# Patient Record
Sex: Female | Born: 1980 | Race: White | Hispanic: No | Marital: Married | Smoking: Never smoker
Health system: Southern US, Community
[De-identification: ages and names within clinical notes are randomized; demographics above are authoritative.]

## PROBLEM LIST (undated history)

## (undated) DIAGNOSIS — Z8619 Personal history of other infectious and parasitic diseases: Secondary | ICD-10-CM

## (undated) DIAGNOSIS — G5600 Carpal tunnel syndrome, unspecified upper limb: Secondary | ICD-10-CM

## (undated) DIAGNOSIS — G43909 Migraine, unspecified, not intractable, without status migrainosus: Secondary | ICD-10-CM

## (undated) HISTORY — PX: APPENDECTOMY: SHX54

## (undated) HISTORY — DX: Personal history of other infectious and parasitic diseases: Z86.19

---

## 2005-06-28 ENCOUNTER — Other Ambulatory Visit: Admission: RE | Admit: 2005-06-28 | Discharge: 2005-06-28 | Payer: Self-pay | Admitting: Obstetrics and Gynecology

## 2011-11-07 ENCOUNTER — Other Ambulatory Visit: Payer: Self-pay | Admitting: Obstetrics and Gynecology

## 2011-11-07 DIAGNOSIS — N6310 Unspecified lump in the right breast, unspecified quadrant: Secondary | ICD-10-CM

## 2011-11-14 ENCOUNTER — Ambulatory Visit
Admission: RE | Admit: 2011-11-14 | Discharge: 2011-11-14 | Disposition: A | Discharge status: Home / Self Care | Payer: BC Managed Care – PPO | Source: Ambulatory Visit | Attending: Obstetrics and Gynecology | Admitting: Obstetrics and Gynecology

## 2011-11-14 ENCOUNTER — Other Ambulatory Visit: Payer: Self-pay

## 2011-11-14 DIAGNOSIS — N6310 Unspecified lump in the right breast, unspecified quadrant: Secondary | ICD-10-CM

## 2014-11-09 LAB — OB RESULTS CONSOLE GC/CHLAMYDIA
CHLAMYDIA, DNA PROBE: NEGATIVE
GC PROBE AMP, GENITAL: NEGATIVE

## 2014-11-09 LAB — OB RESULTS CONSOLE HIV ANTIBODY (ROUTINE TESTING): HIV: NONREACTIVE

## 2014-11-09 LAB — OB RESULTS CONSOLE HEPATITIS B SURFACE ANTIGEN: Hepatitis B Surface Ag: NEGATIVE

## 2014-11-09 LAB — OB RESULTS CONSOLE ANTIBODY SCREEN: ANTIBODY SCREEN: NEGATIVE

## 2014-11-09 LAB — OB RESULTS CONSOLE RUBELLA ANTIBODY, IGM: Rubella: IMMUNE

## 2014-11-09 LAB — OB RESULTS CONSOLE ABO/RH: RH Type: POSITIVE

## 2014-11-09 LAB — OB RESULTS CONSOLE RPR: RPR: NONREACTIVE

## 2015-01-21 ENCOUNTER — Other Ambulatory Visit (HOSPITAL_COMMUNITY): Payer: Self-pay | Admitting: Obstetrics and Gynecology

## 2015-01-21 DIAGNOSIS — Z3689 Encounter for other specified antenatal screening: Secondary | ICD-10-CM

## 2015-01-22 ENCOUNTER — Encounter (HOSPITAL_COMMUNITY): Payer: Self-pay

## 2015-01-22 ENCOUNTER — Other Ambulatory Visit (HOSPITAL_COMMUNITY): Payer: Self-pay | Admitting: Obstetrics and Gynecology

## 2015-01-22 DIAGNOSIS — Z3A21 21 weeks gestation of pregnancy: Secondary | ICD-10-CM

## 2015-01-22 DIAGNOSIS — Z3689 Encounter for other specified antenatal screening: Secondary | ICD-10-CM

## 2015-01-25 ENCOUNTER — Other Ambulatory Visit (HOSPITAL_COMMUNITY): Payer: Self-pay | Admitting: Obstetrics and Gynecology

## 2015-01-25 ENCOUNTER — Ambulatory Visit (HOSPITAL_COMMUNITY)
Admission: RE | Admit: 2015-01-25 | Discharge: 2015-01-25 | Disposition: A | Discharge status: Home / Self Care | Payer: BC Managed Care – PPO | Source: Ambulatory Visit | Attending: Obstetrics and Gynecology | Admitting: Obstetrics and Gynecology

## 2015-01-25 DIAGNOSIS — Z3689 Encounter for other specified antenatal screening: Secondary | ICD-10-CM

## 2015-01-25 DIAGNOSIS — Z3A21 21 weeks gestation of pregnancy: Secondary | ICD-10-CM | POA: Insufficient documentation

## 2015-01-25 DIAGNOSIS — O283 Abnormal ultrasonic finding on antenatal screening of mother: Secondary | ICD-10-CM

## 2015-02-19 ENCOUNTER — Encounter (HOSPITAL_COMMUNITY): Payer: Self-pay

## 2015-02-19 ENCOUNTER — Other Ambulatory Visit (HOSPITAL_COMMUNITY): Payer: Self-pay

## 2015-02-23 ENCOUNTER — Encounter (HOSPITAL_COMMUNITY): Payer: Self-pay

## 2015-02-23 ENCOUNTER — Other Ambulatory Visit (HOSPITAL_COMMUNITY): Payer: Self-pay | Admitting: Maternal and Fetal Medicine

## 2015-02-23 ENCOUNTER — Ambulatory Visit (HOSPITAL_COMMUNITY)
Admission: RE | Admit: 2015-02-23 | Discharge: 2015-02-23 | Disposition: A | Discharge status: Home / Self Care | Payer: BC Managed Care – PPO | Source: Ambulatory Visit | Attending: Obstetrics and Gynecology | Admitting: Obstetrics and Gynecology

## 2015-02-23 DIAGNOSIS — O35EXX0 Maternal care for other (suspected) fetal abnormality and damage, fetal genitourinary anomalies, not applicable or unspecified: Secondary | ICD-10-CM

## 2015-02-23 DIAGNOSIS — O358XX0 Maternal care for other (suspected) fetal abnormality and damage, not applicable or unspecified: Secondary | ICD-10-CM

## 2015-02-23 DIAGNOSIS — Z3A25 25 weeks gestation of pregnancy: Secondary | ICD-10-CM | POA: Diagnosis not present

## 2015-02-23 DIAGNOSIS — Z0489 Encounter for examination and observation for other specified reasons: Secondary | ICD-10-CM

## 2015-02-23 DIAGNOSIS — O283 Abnormal ultrasonic finding on antenatal screening of mother: Secondary | ICD-10-CM | POA: Insufficient documentation

## 2015-02-23 DIAGNOSIS — IMO0002 Reserved for concepts with insufficient information to code with codable children: Secondary | ICD-10-CM

## 2015-02-23 HISTORY — DX: Migraine, unspecified, not intractable, without status migrainosus: G43.909

## 2015-02-23 HISTORY — DX: Carpal tunnel syndrome, unspecified upper limb: G56.00

## 2015-03-08 ENCOUNTER — Inpatient Hospital Stay (HOSPITAL_COMMUNITY)
Admission: AD | Admit: 2015-03-08 | Payer: BC Managed Care – PPO | Source: Ambulatory Visit | Admitting: Obstetrics and Gynecology

## 2015-03-26 ENCOUNTER — Ambulatory Visit (HOSPITAL_COMMUNITY)
Admission: RE | Admit: 2015-03-26 | Discharge: 2015-03-26 | Disposition: A | Discharge status: Home / Self Care | Payer: BC Managed Care – PPO | Source: Ambulatory Visit | Attending: Obstetrics and Gynecology | Admitting: Obstetrics and Gynecology

## 2015-03-26 ENCOUNTER — Encounter (HOSPITAL_COMMUNITY): Payer: Self-pay

## 2015-03-26 DIAGNOSIS — O358XX0 Maternal care for other (suspected) fetal abnormality and damage, not applicable or unspecified: Secondary | ICD-10-CM | POA: Insufficient documentation

## 2015-03-26 DIAGNOSIS — O35EXX0 Maternal care for other (suspected) fetal abnormality and damage, fetal genitourinary anomalies, not applicable or unspecified: Secondary | ICD-10-CM

## 2015-03-28 NOTE — L&D Delivery Note (Signed)
VAVD of VMI at 2146 on 05/28/15.  EBL 300cc.  APGARs 8,9.  Placenta to L&D. Patient pushing effectively x 1 hour with minimal descent.  Patient was counseled for vacuum assistance including risk of cephalohematoma and intracranial hemorrhage.  The patient accepted.  The Kiwi vacuum was applied and after 6 maternal push/pull efforts and a single pop off, the head delivered.  Loose nuchal x 1 was delivered through.  Body delivered atraumatically.  Baby to abdomen.  Cord was clamped and cut.  Placenta delivered S/I/3VC.  Fundus was firmed with pitocin and massage.  RV exam revealed 3rd degree laceration.  Using 3-0 Rapide, the repair was performed in the typical fashion.  Mom and baby stable.   Mitchel HonourMegan Shields Pautz, DO

## 2015-04-23 ENCOUNTER — Other Ambulatory Visit (HOSPITAL_COMMUNITY): Payer: Self-pay | Admitting: Maternal and Fetal Medicine

## 2015-04-23 ENCOUNTER — Encounter (HOSPITAL_COMMUNITY): Payer: Self-pay

## 2015-04-23 ENCOUNTER — Ambulatory Visit (HOSPITAL_COMMUNITY)
Admission: RE | Admit: 2015-04-23 | Discharge: 2015-04-23 | Disposition: A | Discharge status: Home / Self Care | Payer: BC Managed Care – PPO | Source: Ambulatory Visit | Attending: Obstetrics and Gynecology | Admitting: Obstetrics and Gynecology

## 2015-04-23 DIAGNOSIS — O35EXX0 Maternal care for other (suspected) fetal abnormality and damage, fetal genitourinary anomalies, not applicable or unspecified: Secondary | ICD-10-CM

## 2015-04-23 DIAGNOSIS — Z3A34 34 weeks gestation of pregnancy: Secondary | ICD-10-CM | POA: Diagnosis not present

## 2015-04-23 DIAGNOSIS — O358XX0 Maternal care for other (suspected) fetal abnormality and damage, not applicable or unspecified: Secondary | ICD-10-CM | POA: Insufficient documentation

## 2015-05-07 ENCOUNTER — Ambulatory Visit (HOSPITAL_COMMUNITY)
Admission: RE | Admit: 2015-05-07 | Discharge: 2015-05-07 | Disposition: A | Discharge status: Home / Self Care | Payer: BC Managed Care – PPO | Source: Ambulatory Visit | Attending: Obstetrics and Gynecology | Admitting: Obstetrics and Gynecology

## 2015-05-07 ENCOUNTER — Other Ambulatory Visit (HOSPITAL_COMMUNITY): Payer: Self-pay | Admitting: Maternal and Fetal Medicine

## 2015-05-07 ENCOUNTER — Encounter (HOSPITAL_COMMUNITY): Payer: Self-pay

## 2015-05-07 DIAGNOSIS — Z3A36 36 weeks gestation of pregnancy: Secondary | ICD-10-CM | POA: Diagnosis not present

## 2015-05-07 DIAGNOSIS — O35EXX0 Maternal care for other (suspected) fetal abnormality and damage, fetal genitourinary anomalies, not applicable or unspecified: Secondary | ICD-10-CM

## 2015-05-07 DIAGNOSIS — Z36 Encounter for antenatal screening of mother: Secondary | ICD-10-CM | POA: Diagnosis present

## 2015-05-07 DIAGNOSIS — O358XX0 Maternal care for other (suspected) fetal abnormality and damage, not applicable or unspecified: Secondary | ICD-10-CM

## 2015-05-07 DIAGNOSIS — Q620 Congenital hydronephrosis: Secondary | ICD-10-CM | POA: Insufficient documentation

## 2015-05-10 LAB — OB RESULTS CONSOLE GBS: STREP GROUP B AG: NEGATIVE

## 2015-05-19 ENCOUNTER — Encounter (HOSPITAL_COMMUNITY): Payer: Self-pay | Admitting: *Deleted

## 2015-05-19 ENCOUNTER — Telehealth (HOSPITAL_COMMUNITY): Payer: Self-pay | Admitting: *Deleted

## 2015-05-19 NOTE — Telephone Encounter (Signed)
Preadmission screen  

## 2015-05-28 ENCOUNTER — Encounter (HOSPITAL_COMMUNITY): Payer: Self-pay

## 2015-05-28 ENCOUNTER — Inpatient Hospital Stay (HOSPITAL_COMMUNITY): Payer: BC Managed Care – PPO | Admitting: Anesthesiology

## 2015-05-28 ENCOUNTER — Inpatient Hospital Stay (HOSPITAL_COMMUNITY)
Admission: RE | Admit: 2015-05-28 | Discharge: 2015-05-30 | DRG: 775 | Disposition: A | Discharge status: Home / Self Care | Payer: BC Managed Care – PPO | Source: Ambulatory Visit | Attending: Obstetrics & Gynecology | Admitting: Obstetrics & Gynecology

## 2015-05-28 DIAGNOSIS — Z3490 Encounter for supervision of normal pregnancy, unspecified, unspecified trimester: Secondary | ICD-10-CM

## 2015-05-28 DIAGNOSIS — Z3A39 39 weeks gestation of pregnancy: Secondary | ICD-10-CM | POA: Diagnosis not present

## 2015-05-28 DIAGNOSIS — O358XX0 Maternal care for other (suspected) fetal abnormality and damage, not applicable or unspecified: Secondary | ICD-10-CM | POA: Diagnosis present

## 2015-05-28 DIAGNOSIS — O26893 Other specified pregnancy related conditions, third trimester: Secondary | ICD-10-CM | POA: Diagnosis present

## 2015-05-28 LAB — CBC
HCT: 36 % (ref 36.0–46.0)
Hemoglobin: 12.8 g/dL (ref 12.0–15.0)
MCH: 30.8 pg (ref 26.0–34.0)
MCHC: 35.6 g/dL (ref 30.0–36.0)
MCV: 86.5 fL (ref 78.0–100.0)
PLATELETS: 193 10*3/uL (ref 150–400)
RBC: 4.16 MIL/uL (ref 3.87–5.11)
RDW: 13.6 % (ref 11.5–15.5)
WBC: 10.9 10*3/uL — AB (ref 4.0–10.5)

## 2015-05-28 LAB — TYPE AND SCREEN
ABO/RH(D): O POS
ANTIBODY SCREEN: NEGATIVE

## 2015-05-28 LAB — ABO/RH: ABO/RH(D): O POS

## 2015-05-28 MED ORDER — EPHEDRINE 5 MG/ML INJ
10.0000 mg | INTRAVENOUS | Status: DC | PRN
  Filled 2015-05-28: qty 2

## 2015-05-28 MED ORDER — OXYTOCIN 10 UNIT/ML IJ SOLN
1.0000 m[IU]/min | INTRAVENOUS | Status: DC
  Administered 2015-05-28: 2 m[IU]/min via INTRAVENOUS
  Filled 2015-05-28: qty 10

## 2015-05-28 MED ORDER — LACTATED RINGERS IV SOLN
500.0000 mL | INTRAVENOUS | Status: DC | PRN
  Administered 2015-05-28: 1000 mL via INTRAVENOUS
  Administered 2015-05-28: 500 mL via INTRAVENOUS
  Administered 2015-05-29: 300 mL via INTRAVENOUS

## 2015-05-28 MED ORDER — ONDANSETRON HCL 4 MG/2ML IJ SOLN: 4.0000 mg | Freq: Four times a day (QID) | INTRAMUSCULAR | Status: DC | PRN

## 2015-05-28 MED ORDER — CITRIC ACID-SODIUM CITRATE 334-500 MG/5ML PO SOLN: 30.0000 mL | ORAL | Status: DC | PRN

## 2015-05-28 MED ORDER — LACTATED RINGERS IV SOLN
INTRAVENOUS | Status: DC
  Administered 2015-05-28 (×3): via INTRAVENOUS

## 2015-05-28 MED ORDER — OXYTOCIN 10 UNIT/ML IJ SOLN
2.5000 [IU]/h | INTRAMUSCULAR | Status: DC
  Administered 2015-05-28: 2.5 [IU]/h via INTRAVENOUS

## 2015-05-28 MED ORDER — FENTANYL 2.5 MCG/ML BUPIVACAINE 1/10 % EPIDURAL INFUSION (WH - ANES)
14.0000 mL/h | INTRAMUSCULAR | Status: DC | PRN
  Administered 2015-05-28 (×2): 14 mL/h via EPIDURAL
  Filled 2015-05-28 (×2): qty 125

## 2015-05-28 MED ORDER — BUPIVACAINE HCL (PF) 0.25 % IJ SOLN
INTRAMUSCULAR | Status: DC | PRN
  Administered 2015-05-28 (×2): 4 mL via EPIDURAL

## 2015-05-28 MED ORDER — SODIUM BICARBONATE 8.4 % IV SOLN
INTRAVENOUS | Status: DC | PRN
  Administered 2015-05-28: 5 mL via EPIDURAL

## 2015-05-28 MED ORDER — PHENYLEPHRINE 40 MCG/ML (10ML) SYRINGE FOR IV PUSH (FOR BLOOD PRESSURE SUPPORT)
80.0000 ug | PREFILLED_SYRINGE | INTRAVENOUS | Status: AC | PRN
  Administered 2015-05-28 (×3): 80 ug via INTRAVENOUS

## 2015-05-28 MED ORDER — LACTATED RINGERS IV SOLN
500.0000 mL | Freq: Once | INTRAVENOUS | Status: AC
  Administered 2015-05-29: 500 mL via INTRAVENOUS

## 2015-05-28 MED ORDER — LIDOCAINE HCL (PF) 1 % IJ SOLN
30.0000 mL | INTRAMUSCULAR | Status: DC | PRN
  Administered 2015-05-28: 30 mL via SUBCUTANEOUS
  Filled 2015-05-28: qty 30

## 2015-05-28 MED ORDER — TERBUTALINE SULFATE 1 MG/ML IJ SOLN
0.2500 mg | Freq: Once | INTRAMUSCULAR | Status: DC | PRN
  Filled 2015-05-28: qty 1

## 2015-05-28 MED ORDER — OXYTOCIN BOLUS FROM INFUSION
500.0000 mL | INTRAVENOUS | Status: DC
  Administered 2015-05-28: 500 mL via INTRAVENOUS

## 2015-05-28 MED ORDER — LIDOCAINE HCL (PF) 1 % IJ SOLN
INTRAMUSCULAR | Status: DC | PRN
  Administered 2015-05-28 (×2): 5 mL

## 2015-05-28 MED ORDER — BUTORPHANOL TARTRATE 1 MG/ML IJ SOLN: 1.0000 mg | INTRAMUSCULAR | Status: DC | PRN

## 2015-05-28 MED ORDER — ACETAMINOPHEN 325 MG PO TABS
650.0000 mg | ORAL_TABLET | ORAL | Status: DC | PRN
  Administered 2015-05-28: 650 mg via ORAL
  Filled 2015-05-28: qty 2

## 2015-05-28 MED ORDER — FLEET ENEMA 7-19 GM/118ML RE ENEM: 1.0000 | ENEMA | RECTAL | Status: DC | PRN

## 2015-05-28 MED ORDER — PHENYLEPHRINE 40 MCG/ML (10ML) SYRINGE FOR IV PUSH (FOR BLOOD PRESSURE SUPPORT)
80.0000 ug | PREFILLED_SYRINGE | INTRAVENOUS | Status: DC | PRN
  Administered 2015-05-28 (×2): 80 ug via INTRAVENOUS
  Filled 2015-05-28: qty 2
  Filled 2015-05-28 (×2): qty 20

## 2015-05-28 MED ORDER — DIPHENHYDRAMINE HCL 50 MG/ML IJ SOLN: 12.5000 mg | INTRAMUSCULAR | Status: DC | PRN

## 2015-05-28 MED ORDER — OXYCODONE-ACETAMINOPHEN 5-325 MG PO TABS: 2.0000 | ORAL_TABLET | ORAL | Status: DC | PRN

## 2015-05-28 MED ORDER — OXYCODONE-ACETAMINOPHEN 5-325 MG PO TABS
1.0000 | ORAL_TABLET | ORAL | Status: DC | PRN
  Administered 2015-05-28 – 2015-05-29 (×2): 1 via ORAL
  Filled 2015-05-28 (×2): qty 1

## 2015-05-28 NOTE — Consults (Signed)
  Anesthesia Pain Consult Note  Patient: Cheryl Fletcher, 35 y.o., female  Consult Requested by: Mitchel HonourMegan Morris, DO  Reason for Consult: CRNA pain  rounds  Level of Consciousness: alert  Pain: current pain 2 , pain goal 8    Cheryl Fletcher 05/28/2015

## 2015-05-28 NOTE — Anesthesia Preprocedure Evaluation (Signed)

## 2015-05-28 NOTE — Anesthesia Procedure Notes (Signed)
Epidural Patient location during procedure: OB  Staffing Anesthesiologist: Judia Arnott Performed by: anesthesiologist   Preanesthetic Checklist Completed: patient identified, site marked, surgical consent, pre-op evaluation, timeout performed, IV checked, risks and benefits discussed and monitors and equipment checked  Epidural Patient position: sitting Prep: DuraPrep Patient monitoring: heart rate, continuous pulse ox and blood pressure Approach: right paramedian Location: L3-L4 Injection technique: LOR saline  Needle:  Needle type: Tuohy  Needle gauge: 17 G Needle length: 9 cm and 9 Needle insertion depth: 5 cm Catheter type: closed end flexible Catheter size: 20 Guage Catheter at skin depth: 9 cm Test dose: negative  Assessment Events: blood not aspirated, injection not painful, no injection resistance, negative IV test and no paresthesia  Additional Notes Patient identified. Risks/Benefits/Options discussed with patient including but not limited to bleeding, infection, nerve damage, paralysis, failed block, incomplete pain control, headache, blood pressure changes, nausea, vomiting, reactions to medication both or allergic, itching and postpartum back pain. Confirmed with bedside nurse the patient's most recent platelet count. Confirmed with patient that they are not currently taking any anticoagulation, have any bleeding history or any family history of bleeding disorders. Patient expressed understanding and wished to proceed. All questions were answered. Sterile technique was used throughout the entire procedure. Please see nursing notes for vital signs. Test dose was given through epidural needle and negative prior to continuing to dose epidural or start infusion. Warning signs of high block given to the patient including shortness of breath, tingling/numbness in hands, complete motor block, or any concerning symptoms with instructions to call for help. Patient was given  instructions on fall risk and not to get out of bed. All questions and concerns addressed with instructions to call with any issues.   

## 2015-05-28 NOTE — H&P (Signed)
Cheryl Fletcher is a 35 y.o. female presenting for IOL; elective favorable cvx.  Antepartum course complicated by fetal renal abnormalities; [facility]  Baby will require surgery after delivery. Otherwise, antepartum course uncomplicated.  GBS negative.    Maternal Medical History:  Fetal activity: Perceived fetal activity is normal.   Last perceived fetal movement was within the past hour.    Prenatal complications: no prenatal complications Prenatal Complications - Diabetes: none.    OB History    Gravida Para Term Preterm AB TAB SAB Ectopic Multiple Living   1              Past Medical History  Diagnosis Date  . Migraines   . Carpal tunnel syndrome   . Hx of varicella    Past Surgical History  Procedure Laterality Date  . Appendectomy     Family History: family history includes Cancer in her paternal aunt; Kidney disease in her paternal aunt and paternal grandfather; Thyroid disease in her maternal aunt and mother. Social History:  reports that she has never smoked. She has never used smokeless tobacco. She reports that she does not drink alcohol or use illicit drugs.   Prenatal Transfer Tool  Maternal Diabetes: No Genetic Screening: Normal Maternal Ultrasounds/Referrals: Abnormal:  Findings:   Fetal Kidney Anomalies Fetal Ultrasounds or other Referrals:  Referred to Materal Fetal Medicine  Maternal Substance Abuse:  No Significant Maternal Medications:  None Significant Maternal Lab Results:  Lab values include: Group B Strep negative Other Comments:  None  ROS    Blood pressure 112/69, pulse 89, temperature 97.5 F (36.4 C), temperature source Oral, resp. rate 16, height 5\' 7"  (1.702 m), weight 89.359 kg (197 lb), last menstrual period 08/28/2014. Maternal Exam:  Uterine Assessment: Contraction strength is mild.  Contraction frequency is rare.   Abdomen: Patient reports no abdominal tenderness. Fundal height is c/w dates.   Estimated fetal  weight is 7#8.       Physical Exam  Constitutional: She is oriented to person, place, and time. She appears well-developed and well-nourished.  GI: Soft. There is no rebound and no guarding.  Neurological: She is alert and oriented to person, place, and time.  Skin: Skin is warm and dry.  Psychiatric: She has a normal mood and affect. Her behavior is normal.    Prenatal labs: ABO, Rh: O/Positive/-- (08/15 0000) Antibody: Negative (08/15 0000) Rubella: Immune (08/15 0000) RPR: Nonreactive (08/15 0000)  HBsAg: Negative (08/15 0000)  HIV: Non-reactive (08/15 0000)  GBS:     Assessment/Plan: 34yo G1 at 4143w0d for IOL -Will start pitocin -AROM when able -Epidural if desired -Anticipate NSVD   Cheryl Fletcher 05/28/2015, 8:12 AM

## 2015-05-29 ENCOUNTER — Encounter (HOSPITAL_COMMUNITY): Payer: Self-pay | Admitting: Anesthesiology

## 2015-05-29 LAB — CBC
HEMATOCRIT: 29.7 % — AB (ref 36.0–46.0)
HEMATOCRIT: 29.4 % — AB (ref 36.0–46.0)
HEMOGLOBIN: 10.5 g/dL — AB (ref 12.0–15.0)
Hemoglobin: 10.2 g/dL — ABNORMAL LOW (ref 12.0–15.0)
MCH: 30.7 pg (ref 26.0–34.0)
MCH: 30 pg (ref 26.0–34.0)
MCHC: 35.4 g/dL (ref 30.0–36.0)
MCHC: 34.7 g/dL (ref 30.0–36.0)
MCV: 86.8 fL (ref 78.0–100.0)
MCV: 86.5 fL (ref 78.0–100.0)
PLATELETS: 170 10*3/uL (ref 150–400)
Platelets: 178 10*3/uL (ref 150–400)
RBC: 3.42 MIL/uL — ABNORMAL LOW (ref 3.87–5.11)
RBC: 3.4 MIL/uL — ABNORMAL LOW (ref 3.87–5.11)
RDW: 13.5 % (ref 11.5–15.5)
RDW: 13.6 % (ref 11.5–15.5)
WBC: 17.2 10*3/uL — AB (ref 4.0–10.5)
WBC: 16.1 10*3/uL — AB (ref 4.0–10.5)

## 2015-05-29 MED ORDER — BENZOCAINE-MENTHOL 20-0.5 % EX AERO
1.0000 "application " | INHALATION_SPRAY | CUTANEOUS | Status: DC | PRN
  Filled 2015-05-29: qty 56

## 2015-05-29 MED ORDER — SIMETHICONE 80 MG PO CHEW: 80.0000 mg | CHEWABLE_TABLET | ORAL | Status: DC | PRN

## 2015-05-29 MED ORDER — TETANUS-DIPHTH-ACELL PERTUSSIS 5-2.5-18.5 LF-MCG/0.5 IM SUSP: 0.5000 mL | Freq: Once | INTRAMUSCULAR | Status: DC

## 2015-05-29 MED ORDER — DOCUSATE SODIUM 100 MG PO CAPS
100.0000 mg | ORAL_CAPSULE | Freq: Two times a day (BID) | ORAL | Status: DC
  Administered 2015-05-29 – 2015-05-30 (×2): 100 mg via ORAL
  Filled 2015-05-29 (×2): qty 1

## 2015-05-29 MED ORDER — ONDANSETRON HCL 4 MG PO TABS: 4.0000 mg | ORAL_TABLET | ORAL | Status: DC | PRN

## 2015-05-29 MED ORDER — ONDANSETRON HCL 4 MG/2ML IJ SOLN: 4.0000 mg | INTRAMUSCULAR | Status: DC | PRN

## 2015-05-29 MED ORDER — DIPHENHYDRAMINE HCL 25 MG PO CAPS: 25.0000 mg | ORAL_CAPSULE | Freq: Four times a day (QID) | ORAL | Status: DC | PRN

## 2015-05-29 MED ORDER — ACETAMINOPHEN 325 MG PO TABS: 650.0000 mg | ORAL_TABLET | ORAL | Status: DC | PRN

## 2015-05-29 MED ORDER — OXYCODONE-ACETAMINOPHEN 5-325 MG PO TABS
1.0000 | ORAL_TABLET | ORAL | Status: DC | PRN
  Administered 2015-05-29 – 2015-05-30 (×5): 1 via ORAL
  Filled 2015-05-29 (×4): qty 1

## 2015-05-29 MED ORDER — DIBUCAINE 1 % RE OINT: 1.0000 "application " | TOPICAL_OINTMENT | RECTAL | Status: DC | PRN

## 2015-05-29 MED ORDER — IBUPROFEN 600 MG PO TABS
600.0000 mg | ORAL_TABLET | Freq: Four times a day (QID) | ORAL | Status: DC
  Administered 2015-05-29 – 2015-05-30 (×7): 600 mg via ORAL
  Filled 2015-05-29 (×7): qty 1

## 2015-05-29 MED ORDER — WITCH HAZEL-GLYCERIN EX PADS
1.0000 | MEDICATED_PAD | CUTANEOUS | Status: DC | PRN
Start: 2015-05-29 — End: 2015-05-30

## 2015-05-29 MED ORDER — ZOLPIDEM TARTRATE 5 MG PO TABS: 5.0000 mg | ORAL_TABLET | Freq: Every evening | ORAL | Status: DC | PRN

## 2015-05-29 MED ORDER — PRENATAL MULTIVITAMIN CH
1.0000 | ORAL_TABLET | Freq: Every day | ORAL | Status: DC
  Administered 2015-05-29: 1 via ORAL
  Filled 2015-05-29: qty 1

## 2015-05-29 MED ORDER — OXYCODONE-ACETAMINOPHEN 5-325 MG PO TABS
2.0000 | ORAL_TABLET | ORAL | Status: DC | PRN
  Filled 2015-05-29: qty 2

## 2015-05-29 MED ORDER — SENNOSIDES-DOCUSATE SODIUM 8.6-50 MG PO TABS
2.0000 | ORAL_TABLET | ORAL | Status: DC
  Administered 2015-05-30: 2 via ORAL
  Filled 2015-05-29: qty 2

## 2015-05-29 MED ORDER — LANOLIN HYDROUS EX OINT: TOPICAL_OINTMENT | CUTANEOUS | Status: DC | PRN

## 2015-05-29 NOTE — Progress Notes (Signed)
Pt assisted into stedy and taken to bathroom. Started to feel dizzy so took patient back to return to bed. Pt had syncopal episode with OUT fall. Was in stedy with RN holding on to patient. Ammonia was used and patient was alert and oriented once returned to supine position in bed with assistance of several RN. IV bolus started. Laying left side lying now and feeling less dizzy but still has some residual dizziness. PT is Flushed and felling hot. Fundus firm and bleeding scant, bladder non distended. Dr. Langston MaskerMorris called and notified of events. Will continue to monitor and transfer when stable.

## 2015-05-29 NOTE — Anesthesia Postprocedure Evaluation (Signed)
Anesthesia Post Note  Patient: Cheryl Fletcher  Procedure(s) Performed: * No procedures listed *  Patient location during evaluation: Mother Baby Anesthesia Type: Epidural Level of consciousness: awake and alert Pain management: pain level controlled Vital Signs Assessment: post-procedure vital signs reviewed and stable Respiratory status: spontaneous breathing, nonlabored ventilation and respiratory function stable Cardiovascular status: stable Postop Assessment: no headache, no backache and epidural receding Anesthetic complications: no    Last Vitals:  Filed Vitals:   05/29/15 0430 05/29/15 1645  BP: 107/55 108/60  Pulse: 76 62  Temp: 36.7 C 36.6 C  Resp: 20 18    Last Pain:  Filed Vitals:   05/29/15 1726  PainSc: 5                  Phillips Groutarignan, Acheron Sugg

## 2015-05-29 NOTE — Lactation Note (Signed)
This note was copied from a baby's chart. Lactation Consultation Note  Patient Name: Cheryl Fletcher Today's Date: 05/29/2015 Reason for consult: Follow-up assessment;Breast/nipple pain;Difficult latch   Follow up with mom at RN's request die to nipple pain with feeding. Infant was recently finger fed 5-6cc EBM via finger feeding. When I entered room, infant was latched to right breast in football hold and mom was tearful due to pain. Nipple noted to be compressed when infant removed from breast. Oral assessment noted that infant is chomping on gloved finger and does not extend tongue well over gum line. He is noted to have a humped tongue in the back of his mouth and a high palate. Taught parents to do suck training prior to each feeding. Used # 20 NS for feeding, pain did not improve and nipple again pinched post feed. No milk noted in NS. Attempted again with # 24 NS and nipple again pinched and blanched, no milk noted in NS. Mom then said she could not relatch infant at this time. Mom with compressible breast/nipple tissue, and everted nipples. Both nipples are reddened and left nipple is bruised.   Set up DEBP with instructions for use and cleaning. Mom pumped for 15 minutes on Initiate setting. No colostrum obtained. Attempted to hand express mom, she voiced that it was painful. She then used hand pump and did receive some colostrum that she is planning to feed infant. Mom reports she does not wish to use formula at this time.   Discussed plan with mom to  Attempt to BF infant 8-12 x in 24 hours at first feeding cues. Use nipple shield as needed If unable to BF pump both breast with DEBP for 15 minutes on initiate setting every 2-3 hours Feed all EBM to infant via spoon or finger feeding or syringe Suck training with infant prior to each feeding.  Will need to f/u tomorrow and prn   Maternal Data Formula Feeding for Exclusion: No Has patient been taught Hand Expression?: Yes Does the  patient have breastfeeding experience prior to this delivery?: No  Feeding Feeding Type: Breast Fed Length of feed: 5 min  LATCH Score/Interventions Latch: Grasps breast easily, tongue down, lips flanged, rhythmical sucking. Intervention(s): Skin to skin;Teach feeding cues;Waking techniques  Audible Swallowing: None  Type of Nipple: Everted at rest and after stimulation  Comfort (Breast/Nipple): Filling, red/small blisters or bruises, mild/mod discomfort  Problem noted: Cracked, bleeding, blisters, bruises;Mild/Moderate discomfort Interventions  (Cracked/bleeding/bruising/blister): Expressed breast milk to nipple;Hand pump;Double electric pump  Hold (Positioning): Assistance needed to correctly position infant at breast and maintain latch. Intervention(s): Breastfeeding basics reviewed;Support Pillows;Position options;Skin to skin  LATCH Score: 6  Lactation Tools Discussed/Used WIC Program: Yes Pump Review: Setup, frequency, and cleaning;Milk Storage Initiated by:: Cheryl StainSharon Jayleen Afonso, RN, IBCLC Date initiated:: 05/29/15   Consult Status Consult Status: Follow-up Date: 05/30/15 Follow-up type: In-patient    Cheryl Fletcher 05/29/2015, 9:43 PM

## 2015-05-29 NOTE — Progress Notes (Signed)
Post Partum Day 1  Subjective: up ad lib, voiding, tolerating PO and + flatus.  Per newborn imaging, no urologic surgery required at this time.  Patients decline circ.  Objective: Blood pressure 107/55, pulse 76, temperature 98.1 F (36.7 C), temperature source Oral, resp. rate 20, height 5\' 7"  (1.702 m), weight 89.359 kg (197 lb), last menstrual period 08/28/2014, SpO2 100 %, unknown if currently breastfeeding.  Physical Exam:  General: alert, cooperative and appears stated age Lochia: appropriate Uterine Fundus: firm Incision: healing well, no significant drainage, no dehiscence DVT Evaluation: No evidence of DVT seen on physical exam. Negative Homan's sign. No cords or calf tenderness.   Recent Labs  05/29/15 0158 05/29/15 0551  HGB 10.5* 10.2*  HCT 29.7* 29.4*    Assessment/Plan: Plan for discharge tomorrow and Breastfeeding   LOS: 1 day   Cheryl Fletcher 05/29/2015, 10:05 AM

## 2015-05-30 ENCOUNTER — Ambulatory Visit: Payer: Self-pay

## 2015-05-30 MED ORDER — IBUPROFEN 600 MG PO TABS: 600.0000 mg | ORAL_TABLET | Freq: Four times a day (QID) | ORAL | Status: DC

## 2015-05-30 MED ORDER — OXYCODONE-ACETAMINOPHEN 5-325 MG PO TABS: 1.0000 | ORAL_TABLET | ORAL | Status: DC | PRN

## 2015-05-30 NOTE — Progress Notes (Signed)
Post Partum Day 2 Subjective: no complaints, up ad lib, voiding and tolerating PO  Objective: Blood pressure 104/65, pulse 65, temperature 97.4 F (36.3 C), temperature source Oral, resp. rate 18, height 5\' 7"  (1.702 m), weight 89.359 kg (197 lb), last menstrual period 08/28/2014, SpO2 100 %, unknown if currently breastfeeding.  Physical Exam:  General: alert, cooperative and appears stated age Lochia: appropriate Uterine Fundus: firm Incision: healing well, no significant drainage, no dehiscence DVT Evaluation: No evidence of DVT seen on physical exam. Negative Homan's sign. No cords or calf tenderness.   Recent Labs  05/29/15 0158 05/29/15 0551  HGB 10.5* 10.2*  HCT 29.7* 29.4*    Assessment/Plan: Discharge home and Breastfeeding   LOS: 2 days   Cherlyn Syring 05/30/2015, 9:38 AM

## 2015-05-30 NOTE — Discharge Instructions (Signed)
Call MD for T>100.4, heavy vaginal bleeding, severe abdominal pain, or respiratory distress.  Call office to schedule postpartum visit in 4-6 weeks.  No driving while taking narcotics.  Pelvic rest x 6 weeks. °

## 2015-05-30 NOTE — Anesthesia Postprocedure Evaluation (Signed)
Anesthesia Post Note  Patient: Cheryl Fletcher  Procedure(s) Performed: * No procedures listed *  Patient location during evaluation: Mother Baby Anesthesia Type: Epidural Level of consciousness: awake Pain management: satisfactory to patient Vital Signs Assessment: post-procedure vital signs reviewed and stable Respiratory status: spontaneous breathing Cardiovascular status: stable Postop Assessment: adequate PO intake, no signs of nausea or vomiting, patient able to bend at knees, epidural receding, no backache and no headache Anesthetic complications: no Comments: Current pain 3, pain goal 6    Last Vitals:  Filed Vitals:   05/29/15 1645 05/30/15 0607  BP: 108/60 104/65  Pulse: 62 65  Temp: 36.6 C 36.3 C  Resp: 18 18    Last Pain:  Filed Vitals:   05/30/15 0734  PainSc: 3                  Skya Mccullum

## 2015-05-30 NOTE — Discharge Summary (Signed)
Obstetric Discharge Summary Reason for Admission: induction of labor Prenatal Procedures: none Intrapartum Procedures: spontaneous vaginal delivery and vacuum Postpartum Procedures: none Complications-Operative and Postpartum: 3rd degree perineal laceration HEMOGLOBIN  Date Value Ref Range Status  05/29/2015 10.2* 12.0 - 15.0 g/dL Final   HCT  Date Value Ref Range Status  05/29/2015 29.4* 36.0 - 46.0 % Final    Physical Exam:  General: alert, cooperative and appears stated age Lochia: appropriate Uterine Fundus: firm Incision: healing well, no significant drainage, no dehiscence DVT Evaluation: No evidence of DVT seen on physical exam. Negative Homan's sign. No cords or calf tenderness.  Discharge Diagnoses: Term Pregnancy-delivered  Discharge Information: Date: 05/30/2015 Activity: pelvic rest Diet: routine Medications: PNV, Ibuprofen and Percocet Condition: stable Instructions: refer to practice specific booklet Discharge to: home   Newborn Data: Live born female  Birth Weight: 7 lb 2.5 oz (3245 g) APGAR: 8, 9  Home with mother.  Equan Cogbill 05/30/2015, 9:40 AM

## 2015-05-30 NOTE — Lactation Note (Signed)
This note was copied from a baby's chart. Lactation Consultation Note:  Mother just fed infant 3 ml of ebm.   Parents given AAP supplemental guidelines and discussed mothers wishes to satisfy infants nutritional needs until her milk comes to volume.  Mother was taught and assisted to pace bottle feed infant with Similac. Infant took 30 ml. Reviewed plan of care with mother.  Mother plans to continue to supplement infant and breastfeed using  the nipple shield.  [facility] Mother has a [employer] @ at home. She was advised to post pump every 2-3 hours for 15-20 mins. Mother also states that she has several different kinds of wide based bottle nipples at home.  Mother very receptive to plan of care.   Patient Name: Cheryl Fletcher Today's Date: 05/30/2015 Reason for consult: Follow-up assessment   Maternal Data    Feeding Feeding Type: Formula  LATCH Score/Interventions                      Lactation Tools Discussed/Used     Consult Status Consult Status: Complete (mother plans to follow up with Cheryl Fletcher in am )    Cheryl Fletcher, Cheryl Fletcher 05/30/2015, 1:40 PM

## 2015-05-30 NOTE — Lactation Note (Signed)
This note was copied from a baby's chart. Lactation Consultation Note: Staff nurse paged for latch assist. Infant is 2416 hours old and has had 5 feeding attempts with no latch. Parents are very concerned that infant is hungry . Infant is not showing any feeding cues at this time. Staff nurse teaching mother to do suck training when I arrived in the room. Assist mother with hand expression in hopes to see some cues from infant. Infant was given 5 ml of colostrum with a spoon. Infant latched to left breast in cross-cradle hold. Observed shallow latch. Adjusted infant lips for wider gape. Latch very tight. Mother unable to tolerate latch due to pain scale of #5-6 . Mother became very teary and overwhelmed. Infant was released from breast and observed that nipple was very pinched. Advised father to do STS at this time and allow mother to take a nap. Mother was given a hand pump. Advised mother to page for assistance for next feeding.   Patient Name: Cheryl Fletcher Today's Date: 05/30/2015     Maternal Data    Feeding Feeding Type: Breast Milk  LATCH Score/Interventions                      Lactation Tools Discussed/Used     Consult Status      [name], Loranda Mastel McCoy 05/30/2015, 9:00 AM

## 2015-05-31 LAB — RPR: RPR: NONREACTIVE

## 2017-09-10 ENCOUNTER — Encounter: Payer: Self-pay | Admitting: Podiatry

## 2017-09-10 ENCOUNTER — Ambulatory Visit: Payer: BC Managed Care – PPO | Admitting: Podiatry

## 2017-09-10 VITALS — BP 102/67 | HR 72 | Resp 16

## 2017-09-10 DIAGNOSIS — L02612 Cutaneous abscess of left foot: Secondary | ICD-10-CM

## 2017-09-10 DIAGNOSIS — L600 Ingrowing nail: Secondary | ICD-10-CM | POA: Diagnosis not present

## 2017-09-10 DIAGNOSIS — L03032 Cellulitis of left toe: Secondary | ICD-10-CM

## 2017-09-10 MED ORDER — GENTAMICIN SULFATE 0.1 % EX CREA: 1.0000 "application " | TOPICAL_CREAM | Freq: Three times a day (TID) | CUTANEOUS | 1 refills | Status: DC

## 2017-09-10 NOTE — Patient Instructions (Signed)

## 2017-09-13 LAB — WOUND CULTURE
MICRO NUMBER:: 90722529
SPECIMEN QUALITY:: ADEQUATE

## 2017-09-16 NOTE — Progress Notes (Signed)
   Subjective: Patient presents today for evaluation of pain to the medial and lateral borders of the left hallux that began 5 weeks ago. She reports associated redness and swelling of the area. Patient is concerned for possible ingrown nail. Applying pressure and wearing certain shoes increases the pain. She states she has had multiple antibiotics for treatment with no significant relief. She finished the last round five days ago. Patient presents today for further treatment and evaluation.  Past Medical History:  Diagnosis Date  . Carpal tunnel syndrome   . Hx of varicella   . Migraines     Objective:  General: Well developed, nourished, in no acute distress, alert and oriented x3   Dermatology: Skin is warm, dry and supple bilateral. Medial and lateral borders of the left hallux appears to be erythematous with evidence of an ingrowing nail. Pain on palpation noted to the border of the nail fold. The remaining nails appear unremarkable at this time. There are no open sores, lesions.  Vascular: Dorsalis Pedis artery and Posterior Tibial artery pedal pulses palpable. No lower extremity edema noted.   Neruologic: Grossly intact via light touch bilateral.  Musculoskeletal: Muscular strength within normal limits in all groups bilateral. Normal range of motion noted to all pedal and ankle joints.   Assesement: #1 Paronychia with ingrowing nail medial and lateral borders left hallux  #2 Pain in toe #3 Incurvated nail  Plan of Care:  1. Patient evaluated.  2. Discussed treatment alternatives and plan of care. Explained nail avulsion procedure and post procedure course to patient. 3. Patient opted for total temporay nail avulsion of the left hallux.  4. Prior to procedure, local anesthesia infiltration utilized using 3 ml of a 50:50 mixture of 2% plain lidocaine and 0.5% plain marcaine in a normal hallux block fashion and a betadine prep performed.  5. Total temporary nail avulsion of left  hallux performed.  6. Light dressing applied. 7. Prescription for gentamicin cream provided to patient.  8. Cultures taken.  9. Return to clinic in 2 weeks.   Felecia ShellingBrent M. Mckenna Gamm, DPM [facility]  Dr. Felecia ShellingBrent M. Holley Wirt, DPM    [address]                                        [place] [address]                Office [phone]  Fax [phone]

## 2017-09-19 ENCOUNTER — Telehealth: Payer: Self-pay | Admitting: Podiatry

## 2017-09-19 NOTE — Telephone Encounter (Addendum)
Dr. Logan BoresEvans reviewed the wound culture results and said no bacteria, continue the epsom salt soaks and gentamicin cream dressing and Bactrim and call with concerns. I informed pt of Dr. Logan BoresEvans orders.

## 2017-09-19 NOTE — Telephone Encounter (Signed)
I saw Dr. Logan BoresEvans on 17 June and I have a follow up appointment scheduled on 01 July. I think the infection is worsening now that the toenail which was removed is starting to heal. I'm having increased swelling, redness and pain below the nail bed. I was calling to see if I could go on an oral antibiotic to help or if he has any other suggestions. You can reach me at [phone]. Thank you.

## 2017-09-19 NOTE — Telephone Encounter (Signed)
Pt states the toe has increased swelling and redness and the drainage is yellow now. I told pt to continue the epsom salt soaks and I would inform Dr. Logan BoresEvans and call again. Urgent note given to Dr. Logan BoresEvans.

## 2017-09-24 ENCOUNTER — Ambulatory Visit: Payer: BC Managed Care – PPO | Admitting: Podiatry

## 2017-09-24 ENCOUNTER — Encounter: Payer: Self-pay | Admitting: Podiatry

## 2017-09-24 DIAGNOSIS — L600 Ingrowing nail: Secondary | ICD-10-CM

## 2017-09-30 NOTE — Progress Notes (Signed)
   Subjective: Patient presents today 2 weeks post total nail avulsion procedure of the left hallux. She states she is still experiencing some soreness of the area but states it has improved. She has been applying gentamicin cream daily as directed. Patient is here for further evaluation and treatment.   Past Medical History:  Diagnosis Date  . Carpal tunnel syndrome   . Hx of varicella   . Migraines     Objective: Skin is warm, dry and supple. Nail bed and respective nail fold appears to be healing appropriately. Open wound to the associated nail fold with a granular wound base and moderate amount of fibrotic tissue. Minimal drainage noted.  Assessment: #1 postop temporary total nail avulsion left hallux  #2 open wound periungual nail fold and nail bed of respective digit.   Plan of care: #1 patient was evaluated  #2 debridement of open wound was performed to the periungual border and nail fold of the respective toe using a currette. Antibiotic ointment and Band-Aid was applied. #3 cultures reviewed. Negative for growth.  #4 Continue using gentamicin cream daily with a bandage for two weeks.  #5 patient is to return to clinic on a PRN  basis.   Felecia ShellingBrent M. Evans, DPM [facility]  Dr. Felecia ShellingBrent M. Evans, DPM    [address]                                        [place], [place] [address]                Office [phone]  Fax [phone]

## 2018-07-10 ENCOUNTER — Encounter: Payer: Self-pay | Admitting: Podiatry

## 2018-07-10 ENCOUNTER — Other Ambulatory Visit: Payer: Self-pay

## 2018-07-10 ENCOUNTER — Ambulatory Visit: Payer: BC Managed Care – PPO | Admitting: Podiatry

## 2018-07-10 VITALS — Temp 97.5°F

## 2018-07-10 DIAGNOSIS — L600 Ingrowing nail: Secondary | ICD-10-CM

## 2018-07-10 DIAGNOSIS — L03031 Cellulitis of right toe: Secondary | ICD-10-CM

## 2018-07-10 DIAGNOSIS — L02611 Cutaneous abscess of right foot: Secondary | ICD-10-CM | POA: Diagnosis not present

## 2018-07-10 MED ORDER — DOXYCYCLINE HYCLATE 100 MG PO TABS: 100.0000 mg | ORAL_TABLET | Freq: Two times a day (BID) | ORAL | 0 refills | Status: AC

## 2018-07-10 MED ORDER — GENTAMICIN SULFATE 0.1 % EX CREA: 1.0000 "application " | TOPICAL_CREAM | Freq: Two times a day (BID) | CUTANEOUS | 1 refills | Status: AC

## 2018-07-10 NOTE — Progress Notes (Signed)
   Subjective: Patient presents today for evaluation of pain to the medial border of the right great toe. Patient is concerned for possible ingrown nail. Patient presents today for further treatment and evaluation.  She has been trying to manage the ingrown toenail on her home over the past 4 months.  Over the past week she has noticed some increased redness and swelling to the area. Patient has a history of total temporary nail avulsion which was performed last year here in the office.  The nail is slowly growing back but it is a little bit thicker.  Past Medical History:  Diagnosis Date  . Carpal tunnel syndrome   . Hx of varicella   . Migraines     Objective:  General: Well developed, nourished, in no acute distress, alert and oriented x3   Dermatology: Skin is warm, dry and supple bilateral.  Medial border right great toe appears to be erythematous with evidence of an ingrowing nail. Pain on palpation noted to the border of the nail fold. The remaining nails appear unremarkable at this time. There are no open sores, lesions. Hyperkeratotic thickened nail also noted to the left great toe  Vascular: Dorsalis Pedis artery and Posterior Tibial artery pedal pulses palpable. No lower extremity edema noted.   Neruologic: Grossly intact via light touch bilateral.  Musculoskeletal: Muscular strength within normal limits in all groups bilateral. Normal range of motion noted to all pedal and ankle joints.   Assesement: #1 Paronychia with ingrowing nail medial border right great toe #2 Pain in toe #3 Incurvated nail #4 dystrophic nail left great toenail  Plan of Care:  1. Patient evaluated.  2. Discussed treatment alternatives and plan of care. Explained nail avulsion procedure and post procedure course to patient. 3. Patient opted for permanent partial nail avulsion.  4. Prior to procedure, local anesthesia infiltration utilized using 3 ml of a 50:50 mixture of 2% plain lidocaine and 0.5%  plain marcaine in a normal hallux block fashion and a betadine prep performed.  5. Partial permanent nail avulsion with chemical matrixectomy performed using 3x30sec applications of phenol followed by alcohol flush.  6. Light dressing applied. 7.  Prescription for doxycycline 100 mg #14  8.  Dermal bur was utilized to file down the left great toe nail plate.  Recommend OTC urea 40% topical gel 9.  Return to clinic in 2 weeks.   Felecia Shelling, DPM [facility]  Dr. Felecia Shelling, DPM    [address]                                        [place], [place] [address]                Office [phone]  Fax [phone]

## 2018-07-10 NOTE — Patient Instructions (Addendum)
Recommend UREA 40% TOPICAL GEL daily to left great toenail    Epsom Salt Soak Instructions  IF SOAKING IS STILL NECESSARY, START THIS 2 WEEKS AFTER INITIAL PROCEDURE   Place 1/4 cup of epsom salts in a quart of warm tap water.  Soak your foot or feet in the solution for 20 minutes twice a day until you notice the area has dried and a scab has formed. Continue to apply other medications to the area as directed by the doctor such as polysporin, neosporin or cortisporin drops.  IF YOUR SKIN BECOMES IRRITATED WHILE USING THESE INSTRUCTIONS, IT IS OKAY TO SWITCH TO  WHITE VINEGAR AND WATER. Or you may use antibacterial soap and water to keep the toe clean  Monitor for any signs/symptoms of infection. Call the office immediately if any occur or go directly to the emergency room. Call with any questions/concerns.  Long Term Care Instructions-Post Nail Surgery  You have had your ingrown toenail and root treated with a chemical.  This chemical causes a burn that will drain and ooze like a blister.  This can drain for 6-8 weeks or longer.  It is important to keep this area clean, covered, and follow the soaking instructions dispensed at the time of your surgery.  This area will eventually dry and form a scab.  Once the scab forms you no longer need to soak or apply a dressing.  If at any time you experience an increase in pain, redness, swelling, or drainage, you should contact the office as soon as possible.

## 2018-08-27 ENCOUNTER — Encounter: Payer: Self-pay | Admitting: Podiatry

## 2018-08-27 ENCOUNTER — Ambulatory Visit: Payer: BC Managed Care – PPO | Admitting: Podiatry

## 2018-08-27 ENCOUNTER — Other Ambulatory Visit: Payer: Self-pay

## 2018-08-27 VITALS — Temp 98.0°F

## 2018-08-27 DIAGNOSIS — L600 Ingrowing nail: Secondary | ICD-10-CM

## 2018-08-30 NOTE — Progress Notes (Signed)
   Subjective:38 year old female presenting today with a chief complaint of an ingrowing nail of the right hallux that has been red and swollen for the past few weeks. She reports associated purulent drainage. She states she had a partial nail avulsion done for treatment but does not believe it was enough. She has been applying Gentamicin cream for treatment. Touching the toe and wearing shoes increases the pain. Patient is here for further evaluation and treatment.   Past Medical History:  Diagnosis Date  . Carpal tunnel syndrome   . Hx of varicella   . Migraines     Objective:  General: Well developed, nourished, in no acute distress, alert and oriented x3   Dermatology: Skin is warm, dry and supple bilateral. Right great toe appears to be erythematous with evidence of an ingrowing nail. Pain on palpation noted to the border of the nail fold. Purulent drainage noted with erythema. The remaining nails appear unremarkable at this time. There are no open sores, lesions.  Vascular: Dorsalis Pedis artery and Posterior Tibial artery pedal pulses palpable. No lower extremity edema noted.   Neruologic: Grossly intact via light touch bilateral.  Musculoskeletal: Muscular strength within normal limits in all groups bilateral. Normal range of motion noted to all pedal and ankle joints.   Assessment:  #1 Toenail infection right hallux   Plan of Care:  1. Patient evaluated.  2. Discussed treatment alternatives and plan of care. Explained nail avulsion procedure and post procedure course to patient. 3. Patient opted for total temporary nail avulsion right hallux.  4. Prior to procedure, local anesthesia infiltration utilized using 3 ml of a 50:50 mixture of 2% plain lidocaine and 0.5% plain marcaine in a normal hallux block fashion and a betadine prep performed.  5. Light dressing applied. 6. Continue using Gentamicin cream daily.  7. Return to clinic in 3 weeks.   Felecia Shelling, DPM [facility]  Dr. Felecia Shelling, DPM    [address]                                        [place], [place] [address]                Office [phone]  Fax [phone]

## 2018-09-16 ENCOUNTER — Other Ambulatory Visit: Payer: Self-pay | Admitting: Family Medicine

## 2018-09-16 DIAGNOSIS — R5383 Other fatigue: Secondary | ICD-10-CM

## 2018-09-16 DIAGNOSIS — E049 Nontoxic goiter, unspecified: Secondary | ICD-10-CM

## 2018-09-24 ENCOUNTER — Ambulatory Visit
Admission: RE | Admit: 2018-09-24 | Discharge: 2018-09-24 | Disposition: A | Discharge status: Home / Self Care | Payer: BC Managed Care – PPO | Source: Ambulatory Visit | Attending: Family Medicine | Admitting: Family Medicine

## 2018-09-24 DIAGNOSIS — R5383 Other fatigue: Secondary | ICD-10-CM

## 2018-09-24 DIAGNOSIS — E049 Nontoxic goiter, unspecified: Secondary | ICD-10-CM

## 2019-02-03 ENCOUNTER — Other Ambulatory Visit: Payer: Self-pay

## 2019-02-03 DIAGNOSIS — Z20822 Contact with and (suspected) exposure to covid-19: Secondary | ICD-10-CM

## 2019-02-04 LAB — NOVEL CORONAVIRUS, NAA: SARS-CoV-2, NAA: NOT DETECTED

## 2019-07-04 ENCOUNTER — Ambulatory Visit: Payer: BC Managed Care – PPO | Admitting: Podiatry

## 2019-07-04 ENCOUNTER — Other Ambulatory Visit: Payer: Self-pay

## 2019-07-04 DIAGNOSIS — L600 Ingrowing nail: Secondary | ICD-10-CM

## 2019-07-04 DIAGNOSIS — B351 Tinea unguium: Secondary | ICD-10-CM

## 2019-07-04 MED ORDER — TERBINAFINE HCL 250 MG PO TABS: 250.0000 mg | ORAL_TABLET | Freq: Every day | ORAL | 0 refills | Status: AC

## 2019-07-07 NOTE — Progress Notes (Signed)
   Subjective: Patient presents today for evaluation of sharp pain to the lateral border of the right great toe that began a few weeks ago. Patient is concerned for possible ingrown nail. Applying pressure to the toe increases the pain. She has been soaking the toe in Epsom salt for treatment. Patient presents today for further treatment and evaluation.  Past Medical History:  Diagnosis Date  . Carpal tunnel syndrome   . Hx of varicella   . Migraines     Objective:  General: Well developed, nourished, in no acute distress, alert and oriented x3   Dermatology: Skin is warm, dry and supple bilateral. Lateral border of the right great toenail appears to be erythematous with evidence of an ingrowing nail. Pain on palpation noted to the border of the nail fold. Hyperkeratotic, discolored, thickened, onychodystrophy noted to the bilateral great toenail. There are no open sores, lesions.  Vascular: Dorsalis Pedis artery and Posterior Tibial artery pedal pulses palpable. No lower extremity edema noted.   Neruologic: Grossly intact via light touch bilateral.  Musculoskeletal: Muscular strength within normal limits in all groups bilateral. Normal range of motion noted to all pedal and ankle joints.   Assesement: #1 Paronychia with ingrowing nail lateral border right hallux  #2 Pain in toe #3 Incurvated nail #4 Onychomycosis bilateral great toenails   Plan of Care:  1. Patient evaluated.  2. Discussed treatment alternatives and plan of care. Explained nail avulsion procedure and post procedure course to patient. 3. Patient opted for permanent partial nail avulsion of the lateral border right hallux.  4. Prior to procedure, local anesthesia infiltration utilized using 3 ml of a 50:50 mixture of 2% plain lidocaine and 0.5% plain marcaine in a normal hallux block fashion and a betadine prep performed.  5. Partial permanent nail avulsion with chemical matrixectomy performed using 3x30sec  applications of phenol followed by alcohol flush.  6. Light dressing applied. 7. Patient has Gentamicin cream at home.  8. Prescription for Lamisil 250 mg  provided to patient.  9. Inquired about liver function and patient declines any known history of issue or problems.  10. Return to clinic in 2 weeks.  Felecia Shelling, DPM [facility]  Dr. Felecia Shelling, DPM    [address]                                        [place], [place] [address]                Office [phone]  Fax [phone]

## 2019-09-23 IMAGING — US US THYROID
1 series · 14 of 25 positions shown · non-contrast
Comparison: None.

CLINICAL DATA: Thyromegaly, fatigue

EXAM:
THYROID ULTRASOUND
TECHNIQUE: Ultrasound examination of the thyroid gland and adjacent soft
tissues was performed.

[Series 1: us thyroid · 0.05mm/px · 14 of 157 slices shown]
[im 1/157]
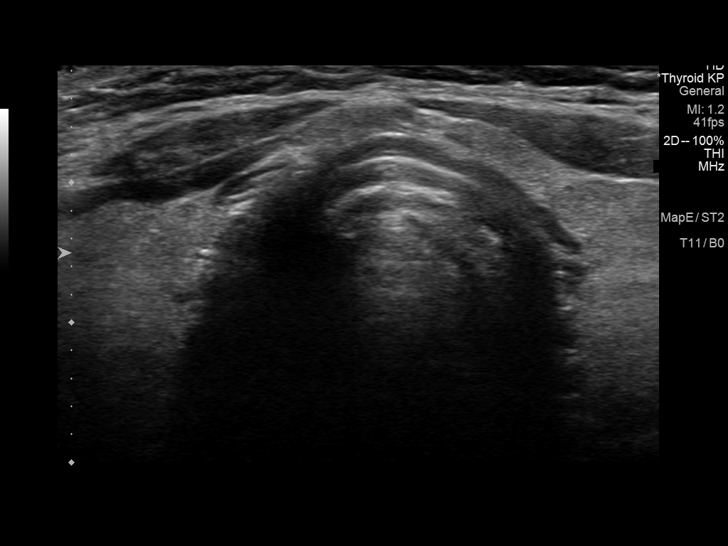
[im 14/157]
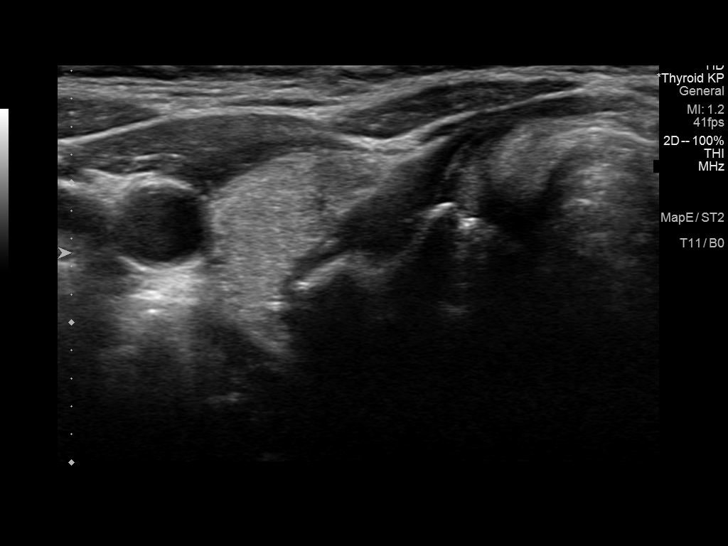
[im 27/157]
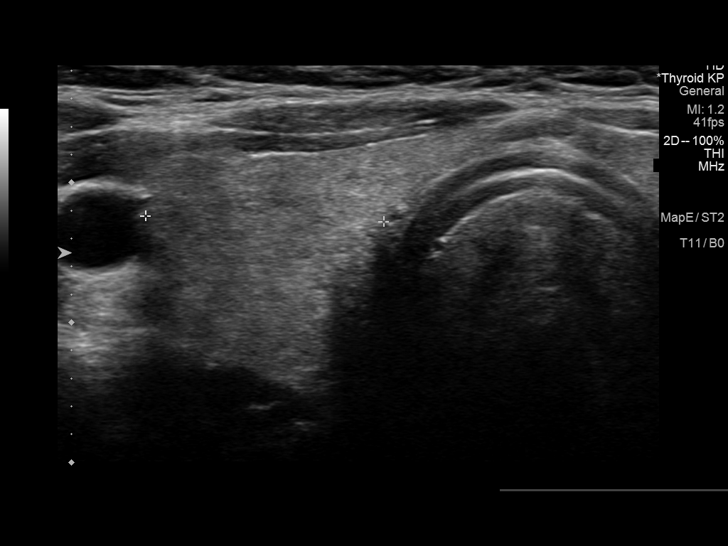
[im 40/157]
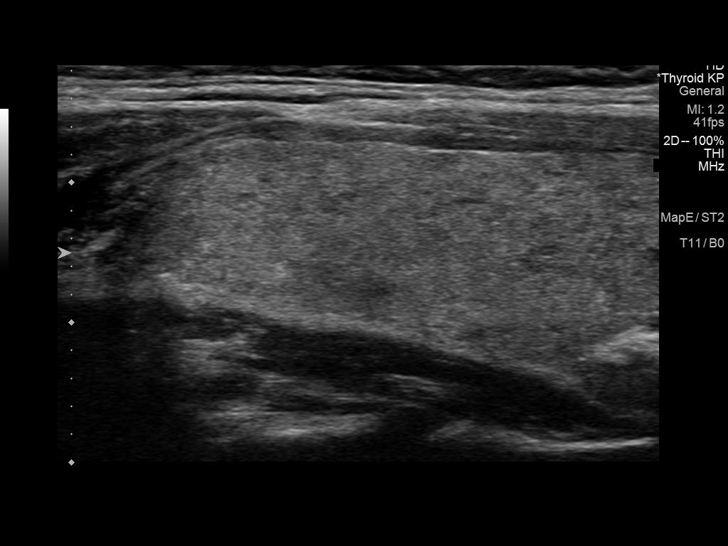
[im 53/157]
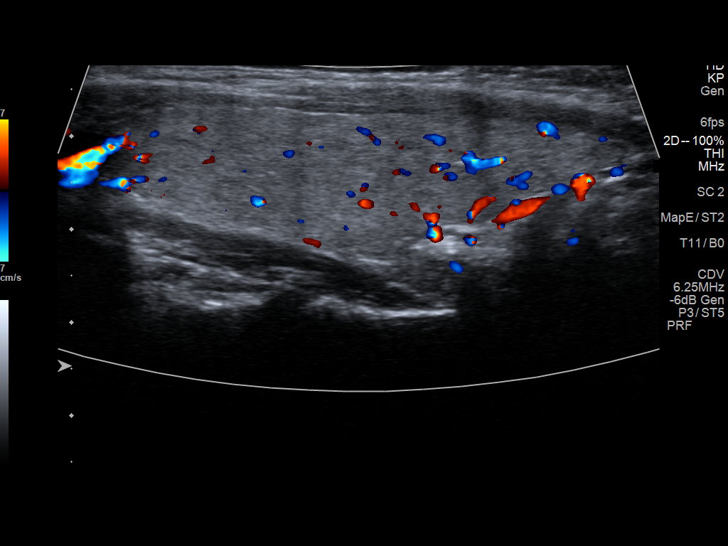
[im 59/157]
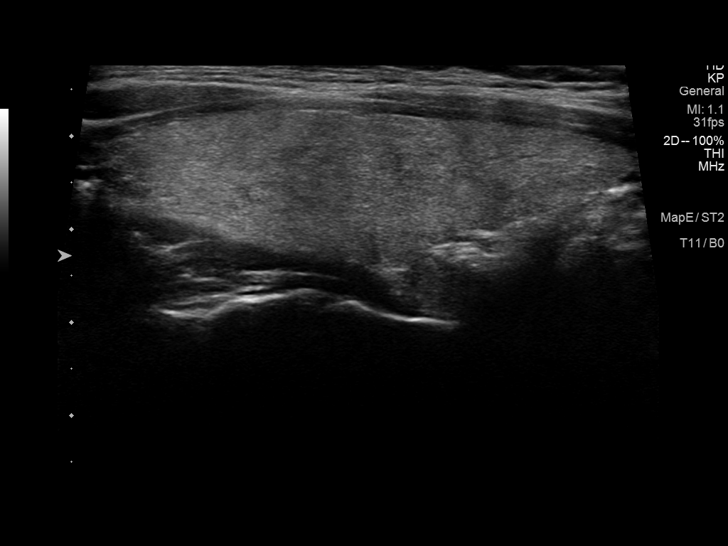
[im 72/157]
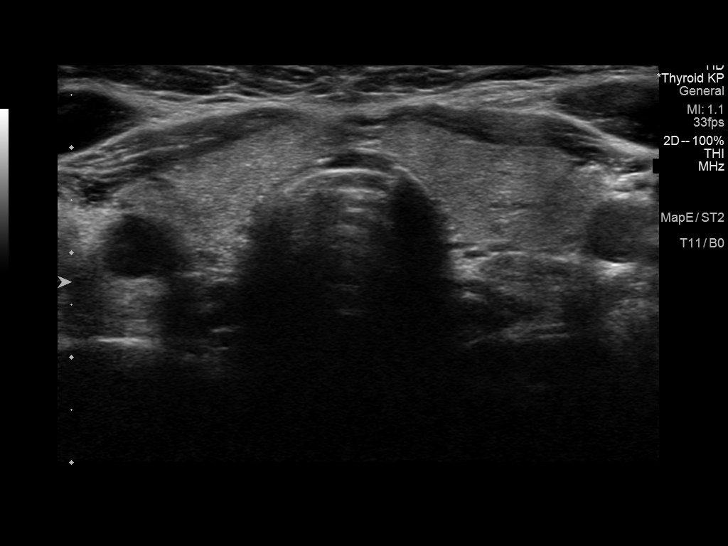
[im 85/157]
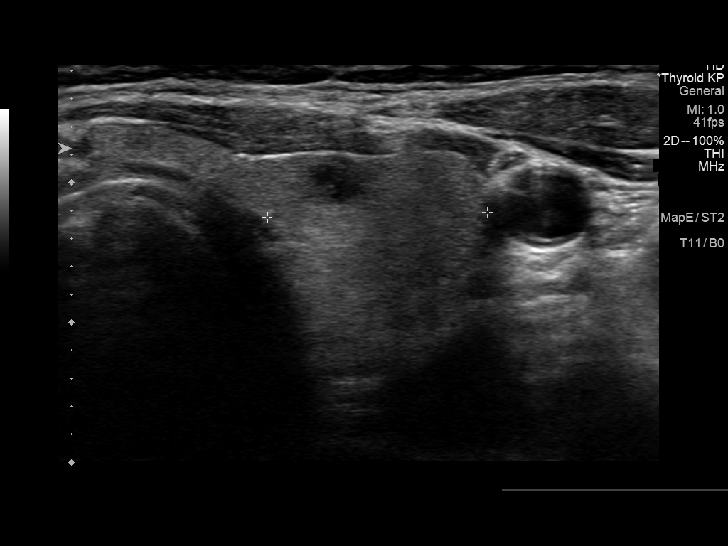
[im 98/157]
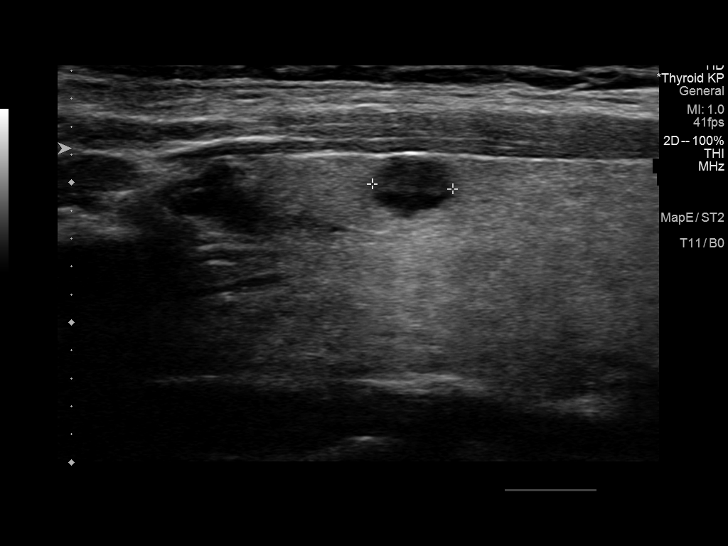
[im 105/157]
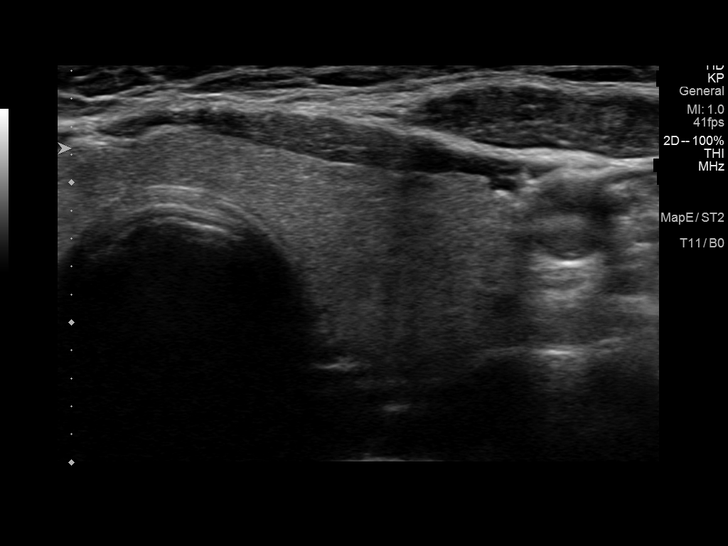
[im 118/157]
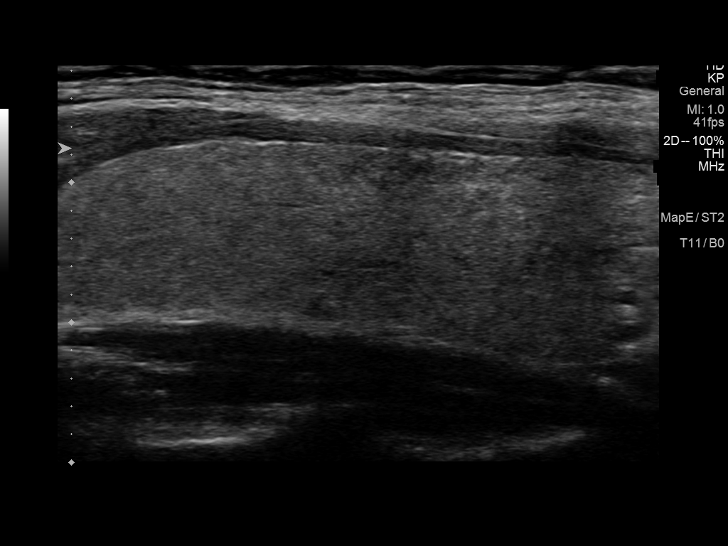
[im 131/157]
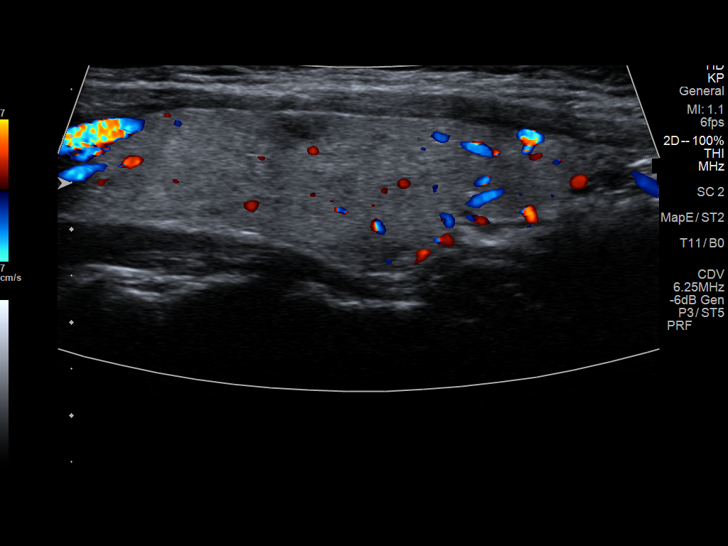
[im 144/157]
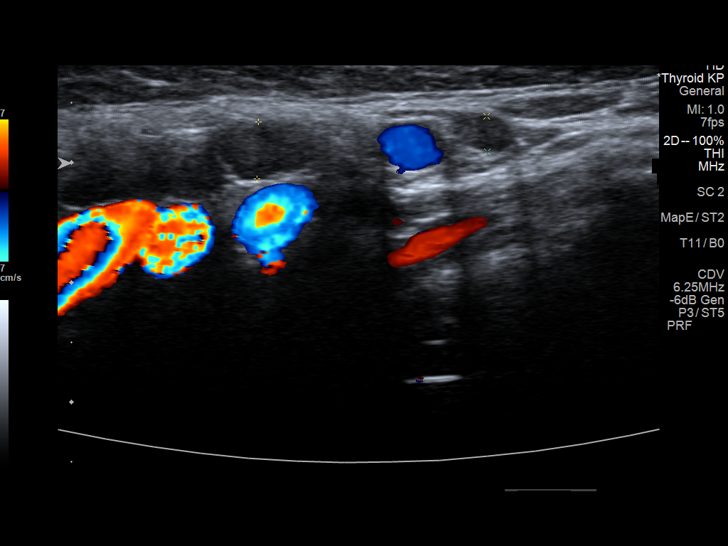
[im 157/157]
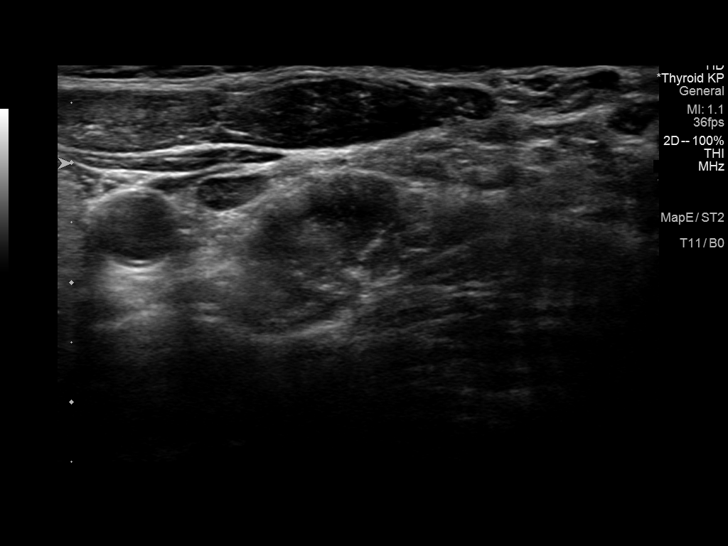

[14 of 25 positions shown; findings below may reference images not displayed]

FINDINGS: Parenchymal Echotexture: Normal

Isthmus: 3 mm

Right lobe: 5.9 x 1.6 x 1.7 cm

Left lobe: 6.0 x 1.7 x 1.6 cm

_________________________________________________________

Estimated total number of nodules >/= 1 cm: 0

Number of spongiform nodules >/=  2 cm not described below (TR1): 0

Number of mixed cystic and solid nodules >/= 1.5 cm not described
below (TR2): 0

_________________________________________________________

Subcentimeter left mid thyroid cystic nodule measures only 6 mm.
This would not meet criteria for any biopsy or follow-up.

No hypervascularity. No other significant thyroid abnormality. No
regional adenopathy.
IMPRESSION: 6 mm left mid thyroid benign cystic nodule. No other significant
thyroid finding by ultrasound.

The above is in keeping with the ACR TI-RADS recommendations - [HOSPITAL] 5162;[DATE].

## 2020-08-16 ENCOUNTER — Ambulatory Visit: Payer: BC Managed Care – PPO | Admitting: Podiatry

## 2020-08-16 ENCOUNTER — Other Ambulatory Visit: Payer: Self-pay

## 2020-08-16 DIAGNOSIS — B351 Tinea unguium: Secondary | ICD-10-CM

## 2020-08-16 DIAGNOSIS — M79675 Pain in left toe(s): Secondary | ICD-10-CM

## 2020-08-31 NOTE — Progress Notes (Signed)
   HPI: 40 y.o. female presenting today for follow-up evaluation regarding the patient's left great toenail.  She says that since last visit there is been no improvement.  Her left great toenail has no growth and is still discolored despite taking oral Lamisil.  She presents for further treatment and evaluation  Past Medical History:  Diagnosis Date  . Carpal tunnel syndrome   . Hx of varicella   . Migraines      Physical Exam: General: The patient is alert and oriented x3 in no acute distress.  Dermatology: Skin is warm, dry and supple bilateral lower extremities. Negative for open lesions or macerations.  Hyperkeratotic discolored dystrophic nail noted to the left hallux nail plate  Vascular: Palpable pedal pulses bilaterally. No edema or erythema noted. Capillary refill within normal limits.  Neurological: Epicritic and protective threshold grossly intact bilaterally.   Musculoskeletal Exam: Range of motion within normal limits to all pedal and ankle joints bilateral. Muscle strength 5/5 in all groups bilateral.   Assessment: 1.  Dystrophic nail/onychomycosis left hallux nail plate   Plan of Care:  1. Patient evaluated.  2.  After discussing the different treatment options the patient opted for total temporary nail avulsion to allow the nail to regrow.  I explained to the patient I have no control over how the nail regrows however there is a chance it may grow back more healthy 3.  The patient agrees to pursue with the nail avulsion procedure.  All possible complications and details of the procedure were explained.  Post procedure care was also provided 4.  The toe was prepped in aseptic manner and digital block performed using 3 mL of 2% lidocaine plain.  Nail was avulsed in its entirety and light dressing applied. 5.  Return to clinic as needed      Felecia Shelling, DPM [facility]  Dr. Felecia Shelling, DPM    [address] [place], [place] [address]                Office [phone]  Fax [phone]
